# Patient Record
Sex: Female | Born: 1960 | Hispanic: No | Marital: Married | Smoking: Never smoker
Health system: Southern US, Community
[De-identification: ages and names within clinical notes are randomized; demographics above are authoritative.]

## PROBLEM LIST (undated history)

## (undated) DIAGNOSIS — R569 Unspecified convulsions: Secondary | ICD-10-CM

## (undated) HISTORY — DX: Unspecified convulsions: R56.9

---

## 1999-12-31 ENCOUNTER — Encounter: Payer: Self-pay | Admitting: General Practice

## 1999-12-31 ENCOUNTER — Ambulatory Visit (HOSPITAL_COMMUNITY): Admission: RE | Admit: 1999-12-31 | Discharge: 1999-12-31 | Payer: Self-pay | Admitting: General Practice

## 2004-07-04 ENCOUNTER — Ambulatory Visit (HOSPITAL_COMMUNITY): Admission: RE | Admit: 2004-07-04 | Discharge: 2004-07-04 | Payer: Self-pay | Admitting: General Practice

## 2010-10-27 ENCOUNTER — Encounter: Payer: Self-pay | Admitting: General Practice

## 2010-10-28 ENCOUNTER — Encounter: Payer: Self-pay | Admitting: General Practice

## 2013-10-07 ENCOUNTER — Emergency Department (HOSPITAL_COMMUNITY): Payer: No Typology Code available for payment source

## 2013-10-07 ENCOUNTER — Encounter (HOSPITAL_COMMUNITY): Payer: Self-pay | Admitting: Emergency Medicine

## 2013-10-07 ENCOUNTER — Emergency Department (HOSPITAL_COMMUNITY)
Admission: EM | Admit: 2013-10-07 | Discharge: 2013-10-07 | Disposition: A | Discharge status: Home / Self Care | Payer: No Typology Code available for payment source | Attending: Emergency Medicine | Admitting: Emergency Medicine

## 2013-10-07 DIAGNOSIS — Y9389 Activity, other specified: Secondary | ICD-10-CM | POA: Insufficient documentation

## 2013-10-07 DIAGNOSIS — R071 Chest pain on breathing: Secondary | ICD-10-CM | POA: Insufficient documentation

## 2013-10-07 DIAGNOSIS — R0789 Other chest pain: Secondary | ICD-10-CM

## 2013-10-07 DIAGNOSIS — S32000A Wedge compression fracture of unspecified lumbar vertebra, initial encounter for closed fracture: Secondary | ICD-10-CM

## 2013-10-07 DIAGNOSIS — M25512 Pain in left shoulder: Secondary | ICD-10-CM

## 2013-10-07 DIAGNOSIS — M25519 Pain in unspecified shoulder: Secondary | ICD-10-CM | POA: Insufficient documentation

## 2013-10-07 DIAGNOSIS — Y92410 Unspecified street and highway as the place of occurrence of the external cause: Secondary | ICD-10-CM | POA: Insufficient documentation

## 2013-10-07 DIAGNOSIS — S32009A Unspecified fracture of unspecified lumbar vertebra, initial encounter for closed fracture: Secondary | ICD-10-CM | POA: Insufficient documentation

## 2013-10-07 MED ORDER — OXYCODONE-ACETAMINOPHEN 5-325 MG PO TABS
2.0000 | ORAL_TABLET | Freq: Once | ORAL | Status: AC
  Administered 2013-10-07: 2 via ORAL
  Filled 2013-10-07: qty 2

## 2013-10-07 MED ORDER — OXYCODONE-ACETAMINOPHEN 5-325 MG PO TABS: 1.0000 | ORAL_TABLET | ORAL | Status: AC | PRN

## 2013-10-07 NOTE — Discharge Instructions (Signed)
Take the prescribed medication as directed.  Do not drive while taking this medication. Wear arm sling as long as needed to stabilize shoulder. Follow-up with neurosurgery, Dr. Lovell SheehanJenkins-- call their office to schedule an appt. Return to the ED for new or worsening symptoms.
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
## 2013-10-08 NOTE — ED Provider Notes (Signed)
Medical screening examination/treatment/procedure(s) were performed by non-physician practitioner and as supervising physician I was immediately available for consultation/collaboration.  EKG Interpretation   None         Oree Mirelez H Kham Zuckerman, MD 10/08/13 0002 

## 2014-04-25 ENCOUNTER — Ambulatory Visit (INDEPENDENT_AMBULATORY_CARE_PROVIDER_SITE_OTHER): Payer: BC Managed Care – PPO | Admitting: Emergency Medicine

## 2014-04-25 ENCOUNTER — Ambulatory Visit: Payer: BC Managed Care – PPO

## 2014-04-25 VITALS — BP 126/80 | HR 74 | Temp 98.1°F | Resp 18 | Ht 61.0 in | Wt 185.0 lb

## 2014-04-25 DIAGNOSIS — M25521 Pain in right elbow: Secondary | ICD-10-CM

## 2014-04-25 DIAGNOSIS — M25529 Pain in unspecified elbow: Secondary | ICD-10-CM

## 2014-04-25 DIAGNOSIS — S42209A Unspecified fracture of upper end of unspecified humerus, initial encounter for closed fracture: Secondary | ICD-10-CM

## 2014-04-25 MED ORDER — HYDROCODONE-ACETAMINOPHEN 5-325 MG PO TABS: 1.0000 | ORAL_TABLET | ORAL | Status: AC | PRN

## 2014-04-25 NOTE — Patient Instructions (Signed)
Humerus Fracture, Treated with Immobilization  The humerus is the large bone in your upper arm. You have a broken (fractured) humerus. These fractures are easily diagnosed with X-rays.  TREATMENT   Simple fractures which will heal without disability are treated with simple immobilization. Immobilization means you will wear a cast, splint, or sling. You have a fracture which will do well with immobilization. The fracture will heal well simply by being held in a good position until it is stable enough to begin range of motion exercises. Do not take part in activities which would further injure your arm.   HOME CARE INSTRUCTIONS    Put ice on the injured area.   Put ice in a plastic bag.   Place a towel between your skin and the bag.   Leave the ice on for 15-20 minutes, 03-04 times a day.   If you have a cast:   Do not scratch the skin under the cast using sharp or pointed objects.   Check the skin around the cast every day. You may put lotion on any red or sore areas.   Keep your cast dry and clean.   If you have a splint:   Wear the splint as directed.   Keep your splint dry and clean.   You may loosen the elastic around the splint if your fingers become numb, tingle, or turn cold or blue.   If you have a sling:   Wear the sling as directed.   Do not put pressure on any part of your cast or splint until it is fully hardened.   Your cast or splint can be protected during bathing with a plastic bag. Do not lower the cast or splint into water.   Only take over-the-counter or prescription medicines for pain, discomfort, or fever as directed by your caregiver.   Do range of motion exercises as instructed by your caregiver.   Follow up as directed by your caregiver. This is very important in order to avoid permanent injury or disability and chronic pain.  SEEK IMMEDIATE MEDICAL CARE IF:    Your skin or nails in the injured arm turn blue or gray.   Your arm feels cold or numb.   You develop severe  pain in the injured arm.   You are having problems with the medicines you were given.  MAKE SURE YOU:    Understand these instructions.   Will watch your condition.   Will get help right away if you are not doing well or get worse.  Document Released: 12/30/2000 Document Revised: 12/16/2011 Document Reviewed: 11/07/2010  Exit[employer] Patient Information 2015 [employer]. This information is not intended to replace advice given to you by your health care provider. Make sure you discuss any questions you have with your health care provider.

## 2014-04-25 NOTE — Progress Notes (Signed)
[facility] [address], [place] [id number] [phone]  Date:  04/25/2014   Name:  Nichole PellegriniSarabjit Liu   DOB:  1960-11-01   MRN:  [id number]  PCP:  No PCP Per Patient    Chief Complaint: Shoulder Pain   History of Present Illness:  Nichole PellegriniSarabjit Liu is a 53 y.o. very pleasant female patient who presents with the following:  Patient was walking and tripped over a wire and fell, injured her right shoulder.  Now not able to mover her arm.  Has pain in shoulder.  No improvement with ice or immobilization.  No improvement with over the counter medications or other home remedies. Denies other complaint or health concern today.   There are no active problems to display for this patient.   Past Medical History  Diagnosis Date  . Seizures     History reviewed. No pertinent past surgical history.  History  Substance Use Topics  . Smoking status: Never Smoker   . Smokeless tobacco: Not on file  . Alcohol Use: No    No family history on file.  No Known Allergies  Medication list has been reviewed and updated.  Current Outpatient Prescriptions on File Prior to Visit  Medication Sig Dispense Refill  . levothyroxine (SYNTHROID, LEVOTHROID) 100 MCG tablet Take 100 mcg by mouth daily before breakfast.      . acetaminophen (TYLENOL) 500 MG tablet Take 500 mg by mouth every 6 (six) hours as needed for mild pain.      Marland Kitchen. oxyCODONE-acetaminophen (PERCOCET/ROXICET) 5-325 MG per tablet Take 1 tablet by mouth every 4 (four) hours as needed.  15 tablet  0   No current facility-administered medications on file prior to visit.    Review of Systems:  As per HPI, otherwise negative.    Physical Examination: Filed Vitals:   04/25/14 1531  BP: 126/80  Pulse: 74  Temp: 98.1 F (36.7 C)  Resp: 18   Filed Vitals:   04/25/14 1531  Height: 5\' 1"  (1.549 m)  Weight: 185 lb (83.915 kg)   Body mass index is 34.97 kg/(m^2). Ideal Body Weight: Weight in (lb) to have BMI =  25: 132   GEN: WDWN, NAD, Non-toxic, Alert & Oriented x 3 HEENT: Atraumatic, Normocephalic.  Ears and Nose: No external deformity. EXTR: No clubbing/cyanosis/edema NEURO: Normal gait.  PSYCH: Normally interactive. Conversant. Not depressed or anxious appearing.  Calm demeanor.  Right shoulder:  Tender and guards too much to fully evaluate Right forearm:  Tender midforearm and guards wrist  Assessment and Plan: Fracture humerus Sling Hydrocodone Ortho  Signed,  Phillips OdorJeffery Babyboy Loya, MD   [facility] reading (PRIMARY) by  Dr. Dareen PianoAnderson. Fracture humeral head.

## 2014-04-26 ENCOUNTER — Telehealth: Payer: Self-pay

## 2014-04-26 NOTE — Telephone Encounter (Signed)
Per patient's daughter Dr. Dareen PianoAnderson was referring her to an orthopaedic and they would receive a call back from our office today. Requesting status of the referral ,Please contact patient at [phone]

## 2014-04-26 NOTE — Telephone Encounter (Signed)
Tried to reach pt- no one lives at that address with that name.  Referral has been sent to [facility]- they call to schedule their own appts with the patient.
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
## 2015-11-15 IMAGING — CR DG WRIST COMPLETE 3+V*R*
2 series · 2 of 2 positions shown · non-contrast
Comparison: None.

CLINICAL DATA: Fall, right wrist pain

EXAM:
RIGHT WRIST - COMPLETE 3+ VIEW

[PA]
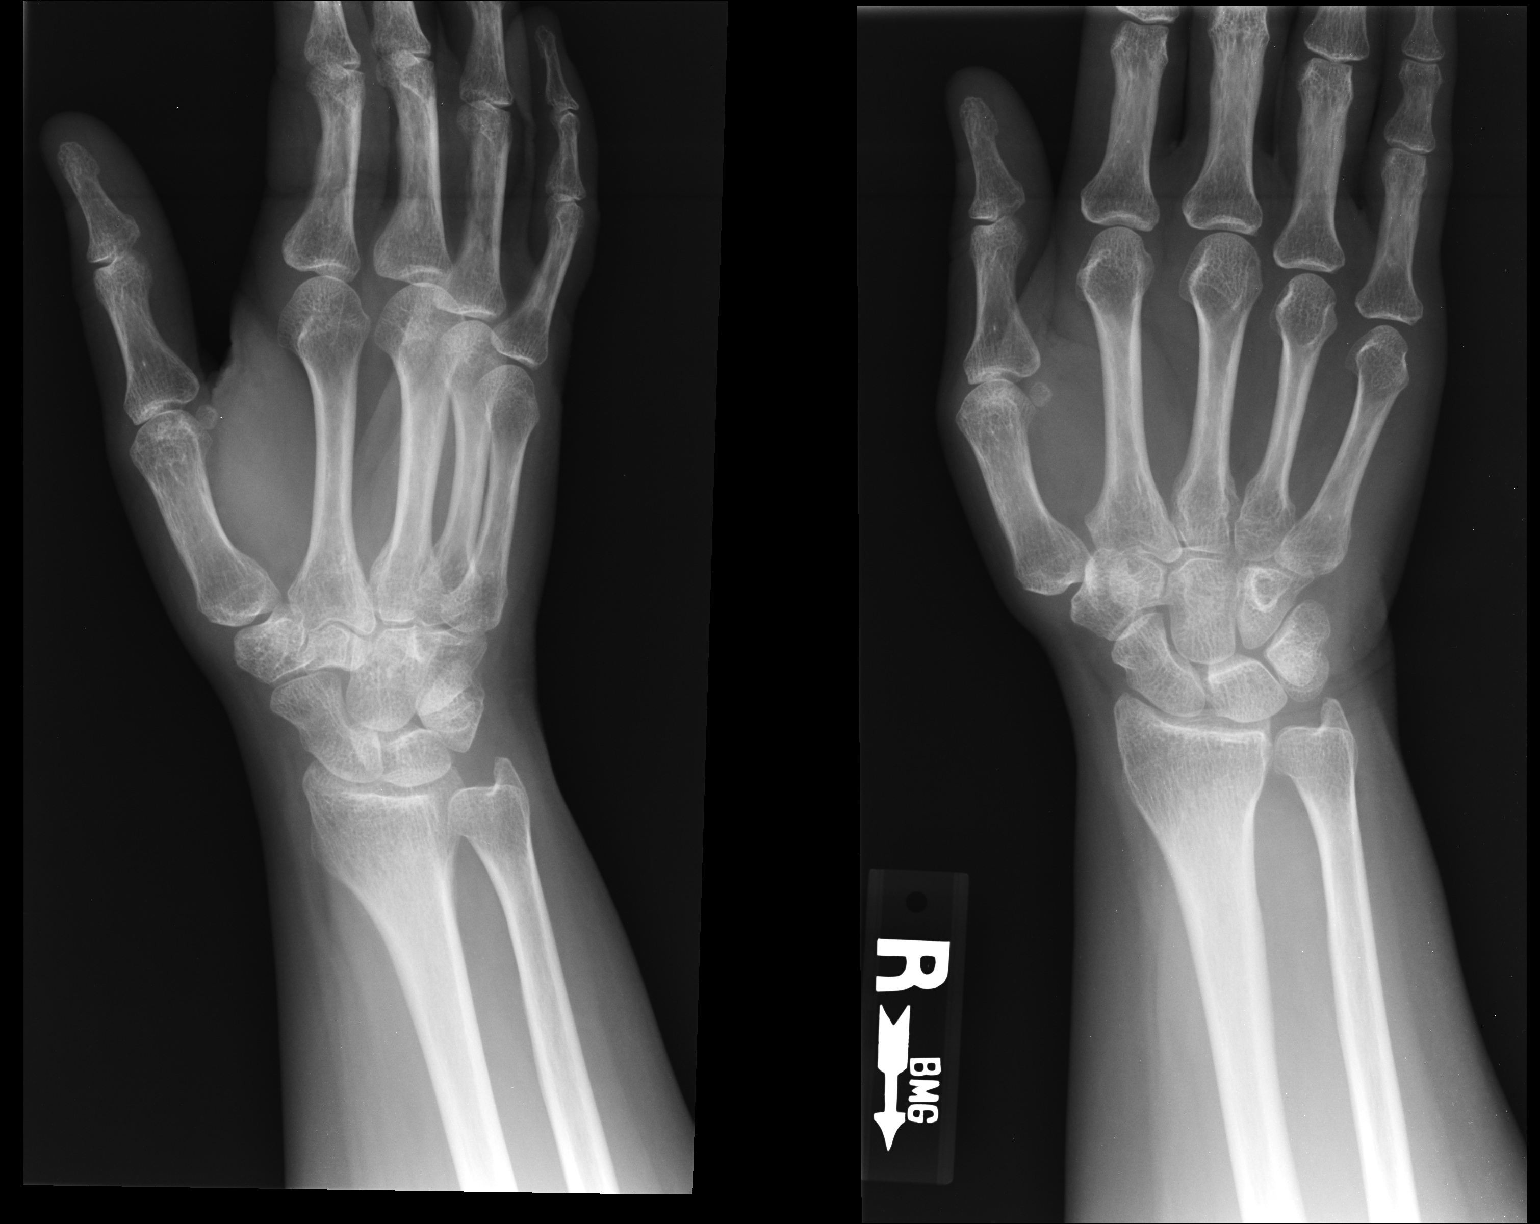

[lateral]
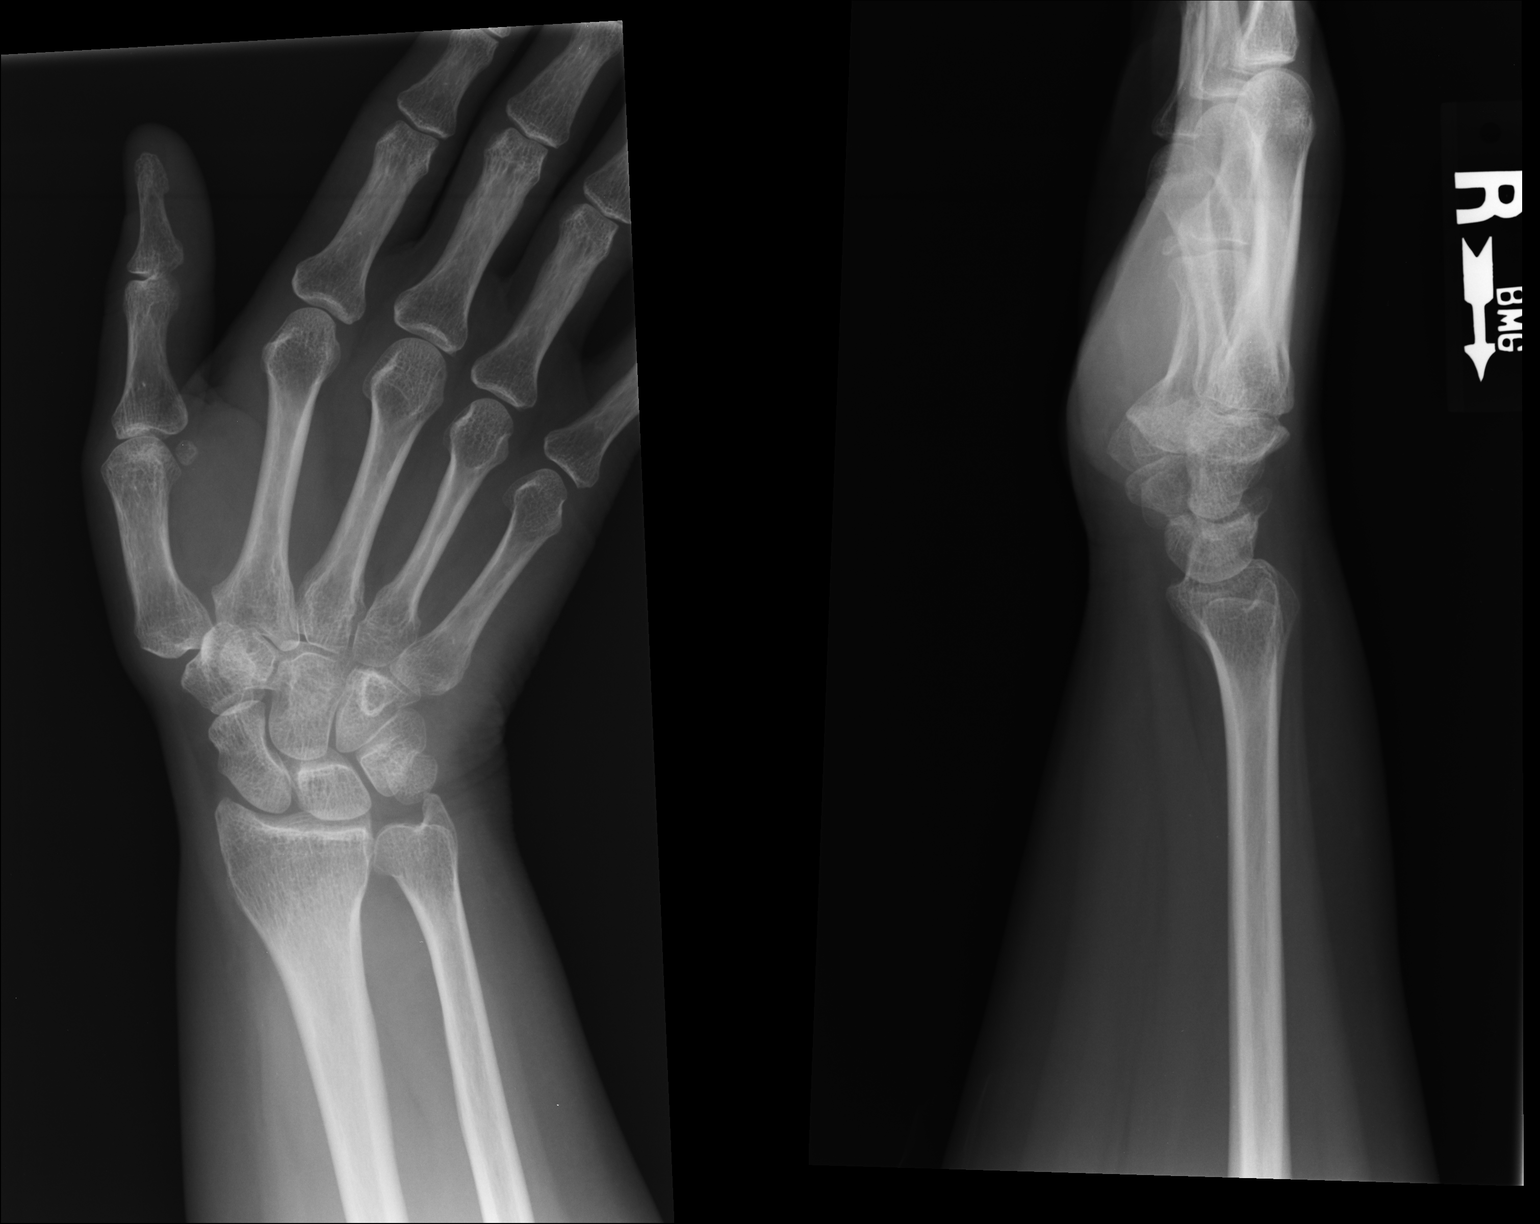

[2 of 2 positions shown; findings below may reference images not displayed]

FINDINGS: There is no evidence of fracture or dislocation. There is no
evidence of arthropathy or other focal bone abnormality. Soft
tissues are unremarkable.
IMPRESSION: Negative.
